# Patient Record
Sex: Male | Born: 1985 | Race: White | Hispanic: No | Marital: Single | Smoking: Never smoker
Health system: Southern US, Community
[De-identification: ages and names within clinical notes are randomized; demographics above are authoritative.]

## PROBLEM LIST (undated history)

## (undated) DIAGNOSIS — K829 Disease of gallbladder, unspecified: Secondary | ICD-10-CM

## (undated) DIAGNOSIS — J45909 Unspecified asthma, uncomplicated: Secondary | ICD-10-CM

---

## 2012-01-16 HISTORY — PX: WISDOM TOOTH EXTRACTION: SHX21

## 2013-10-15 HISTORY — PX: APPENDECTOMY: SHX54

## 2014-02-12 ENCOUNTER — Other Ambulatory Visit: Payer: Self-pay | Admitting: Gastroenterology

## 2014-02-12 DIAGNOSIS — R1012 Left upper quadrant pain: Secondary | ICD-10-CM

## 2014-02-16 ENCOUNTER — Ambulatory Visit
Admission: RE | Admit: 2014-02-16 | Discharge: 2014-02-16 | Disposition: A | Discharge status: Home / Self Care | Payer: 59 | Source: Ambulatory Visit | Attending: Gastroenterology | Admitting: Gastroenterology

## 2014-02-16 DIAGNOSIS — R1012 Left upper quadrant pain: Secondary | ICD-10-CM

## 2014-02-16 MED ORDER — IOHEXOL 300 MG/ML  SOLN
100.0000 mL | Freq: Once | INTRAMUSCULAR | Status: AC | PRN
  Administered 2014-02-16: 100 mL via INTRAVENOUS

## 2014-04-06 ENCOUNTER — Other Ambulatory Visit: Payer: Self-pay | Admitting: Gastroenterology

## 2014-04-06 DIAGNOSIS — R1011 Right upper quadrant pain: Secondary | ICD-10-CM

## 2014-04-14 ENCOUNTER — Ambulatory Visit (HOSPITAL_COMMUNITY)
Admission: RE | Admit: 2014-04-14 | Discharge: 2014-04-14 | Disposition: A | Discharge status: Home / Self Care | Payer: 59 | Source: Ambulatory Visit | Attending: Gastroenterology | Admitting: Gastroenterology

## 2014-04-14 DIAGNOSIS — R1011 Right upper quadrant pain: Secondary | ICD-10-CM | POA: Diagnosis present

## 2014-04-21 ENCOUNTER — Ambulatory Visit (HOSPITAL_COMMUNITY)
Admission: RE | Admit: 2014-04-21 | Discharge: 2014-04-21 | Disposition: A | Discharge status: Home / Self Care | Payer: 59 | Source: Ambulatory Visit | Attending: Gastroenterology | Admitting: Gastroenterology

## 2014-04-21 DIAGNOSIS — R1011 Right upper quadrant pain: Secondary | ICD-10-CM

## 2014-04-21 MED ORDER — SINCALIDE 5 MCG IJ SOLR
INTRAMUSCULAR | Status: AC
  Administered 2014-04-21: 1.82 ug via INTRAVENOUS
  Filled 2014-04-21: qty 5

## 2014-04-21 MED ORDER — TECHNETIUM TC 99M MEBROFENIN IV KIT
5.0000 | PACK | Freq: Once | INTRAVENOUS | Status: AC | PRN
  Administered 2014-04-21: 5 via INTRAVENOUS

## 2014-04-21 MED ORDER — SINCALIDE 5 MCG IJ SOLR
0.0200 ug/kg | Freq: Once | INTRAMUSCULAR | Status: AC
  Administered 2014-04-21: 1.82 ug via INTRAVENOUS

## 2014-05-11 ENCOUNTER — Other Ambulatory Visit: Payer: Self-pay | Admitting: Surgery

## 2014-05-12 ENCOUNTER — Other Ambulatory Visit (HOSPITAL_COMMUNITY): Payer: Self-pay | Admitting: *Deleted

## 2014-05-12 NOTE — Pre-Procedure Instructions (Signed)
Salvadore OxfordBrian Recendiz  05/12/2014   Your procedure is scheduled on:  Friday, May 14, 2014 at 7:30 AM.   Report to [facility] Entr[facility] "A" Admitting Office at 5:30 AM.   Call this number if you have problems the morning of surgery: [phone]   Remember:   Do not eat food or drink liquids after midnight tonight.   Take these medicines the morning of surgery with A SIP OF WATER: Percocet or Tylenol - if needed, Zofran - if needed   Do not wear jewelry.  Do not wear lotions, powders, or cologne. You may wear deodorant.  Men may shave face and neck.  Do not bring valuables to the hospital.  [facility] is not responsible                  for any belongings or valuables.               Contacts, dentures or bridgework may not be worn into surgery.  Leave suitcase in the car. After surgery it may be brought to your room.  For patients admitted to the hospital, discharge time is determined by your                treatment team.               Patients discharged the day of surgery will not be allowed to drive home.    Special Instructions: See "Preparing for Surgery" Instruction sheet.    Please read over the following fact sheets that you were given: Pain Booklet, Coughing and Deep Breathing and Surgical Site Infection Prevention

## 2014-05-13 ENCOUNTER — Encounter (HOSPITAL_COMMUNITY)
Admission: RE | Admit: 2014-05-13 | Discharge: 2014-05-13 | Disposition: A | Discharge status: Home / Self Care | Payer: 59 | Source: Ambulatory Visit | Attending: Surgery | Admitting: Surgery

## 2014-05-13 ENCOUNTER — Encounter (HOSPITAL_COMMUNITY): Payer: Self-pay

## 2014-05-13 DIAGNOSIS — K828 Other specified diseases of gallbladder: Secondary | ICD-10-CM | POA: Diagnosis present

## 2014-05-13 HISTORY — DX: Unspecified asthma, uncomplicated: J45.909

## 2014-05-13 HISTORY — DX: Disease of gallbladder, unspecified: K82.9

## 2014-05-13 LAB — CBC
HEMATOCRIT: 45.9 % (ref 39.0–52.0)
HEMOGLOBIN: 15.7 g/dL (ref 13.0–17.0)
MCH: 29.3 pg (ref 26.0–34.0)
MCHC: 34.2 g/dL (ref 30.0–36.0)
MCV: 85.8 fL (ref 78.0–100.0)
Platelets: 206 10*3/uL (ref 150–400)
RBC: 5.35 MIL/uL (ref 4.22–5.81)
RDW: 12.3 % (ref 11.5–15.5)
WBC: 6.3 10*3/uL (ref 4.0–10.5)

## 2014-05-13 NOTE — Progress Notes (Signed)
   05/13/14 1039  OBSTRUCTIVE SLEEP APNEA  Have you ever been diagnosed with sleep apnea through a sleep study? No  Do you snore loudly (loud enough to be heard through closed doors)?  0  Do you often feel tired, fatigued, or sleepy during the daytime? 0  Has anyone observed you stop breathing during your sleep? 0  Do you have, or are you being treated for high blood pressure? 0  BMI more than 35 kg/m2? 0  Age over 27 years old? 0  Neck circumference greater than 40 cm/16 inches? 1 (17)  Gender: 1

## 2014-05-13 NOTE — H&P (Signed)
Arthur Vaughan 05/11/2014 3:19 PM Location: [facility] Patient #: [id number] DOB: April 15, 1985 Single / Language: [place] / Race: White Male  History of Present Illness Riley Lam A. Magnus Ivan MD; 05/11/2014 3:40 PM) Patient words: Gallbladder.  The patient is a 29 year old male who presents with abdominal pain. This gentleman is referred by Dr. Camillia Herter for severe biliary dyskinesia. He has an interesting story. He was in [place] when he had acute appendicitis in October. Starting in December, he started having right-sided abdominal pain with nausea and vomiting after fatty meals. He now has symptoms daily. The pain can be moderate to severe. The pain refers to the periumbilical area. Bowel movements are normal. He is now having difficulty sleeping because of his discomfort.   Other Problems Rowe Clack, RN, BSN; 05/11/2014 3:19 PM) Other disease, cancer, significant illness  Past Surgical History Rowe Clack, RN, BSN; 05/11/2014 3:19 PM) Appendectomy Oral Surgery  Diagnostic Studies History Rowe Clack, RN, BSN; 05/11/2014 3:19 PM) Colonoscopy 5-10 years ago  Allergies Rowe Clack, RN, BSN; 05/11/2014 3:21 PM) No Known Drug Allergies04/26/2016  Medication History Rowe Clack, RN, BSN; 05/11/2014 3:22 PM) Oxycodone-Acetaminophen (5-325MG  Tablet, Oral as needed) Active. Acetaminophen (  Tablet, Oral as needed) Active. Medications Reconciled  Social History Personnel officer, RN, BSN; 05/11/2014 3:19 PM) Alcohol use Occasional alcohol use. Caffeine use Carbonated beverages, Tea. No drug use Tobacco use Never smoker.  Family History (Rowe Clack, RN, BSN; 05/11/2014 3:19 PM) Hypertension Father, Mother. Malignant Neoplasm Of Pancreas Mother. Melanoma Mother.  Review of Systems Glass blower/designer, BSN; 05/11/2014 3:19 PM) General Present- Fatigue and Weight Gain. Not Present- Appetite Loss, Chills, Fever, Night Sweats  and Weight Loss. HEENT Present- Wears glasses/contact lenses. Not Present- Earache, Hearing Loss, Hoarseness, Nose Bleed, Oral Ulcers, Ringing in the Ears, Seasonal Allergies, Sinus Pain, Sore Throat, Visual Disturbances and Yellow Eyes. Respiratory Not Present- Bloody sputum, Chronic Cough, Difficulty Breathing, Snoring and Wheezing. Breast Not Present- Breast Mass, Breast Pain, Nipple Discharge and Skin Changes. Cardiovascular Not Present- Chest Pain, Difficulty Breathing Lying Down, Leg Cramps, Palpitations, Rapid Heart Rate, Shortness of Breath and Swelling of Extremities. Gastrointestinal Present- Abdominal Pain, Bloating, Indigestion and Nausea. Not Present- Bloody Stool, Change in Bowel Habits, Chronic diarrhea, Constipation, Difficulty Swallowing, Excessive gas, Gets full quickly at meals, Hemorrhoids, Rectal Pain and Vomiting. Male Genitourinary Not Present- Blood in Urine, Change in Urinary Stream, Frequency, Impotence, Nocturia, Painful Urination, Urgency and Urine Leakage. Musculoskeletal Present- Muscle Pain and Muscle Weakness. Not Present- Back Pain, Joint Pain, Joint Stiffness and Swelling of Extremities. Psychiatric Present- Change in Sleep Pattern. Not Present- Anxiety, Bipolar, Depression, Fearful and Frequent crying.   Vitals Glass blower/designer, BSN; 05/11/2014 3:21 PM) 05/11/2014 3:20 PM Weight: 201.6 lb Height: 71in Body Surface Area: 2.14 m Body Mass Index: 28.12 kg/m Temp.: 98.70F(Oral)  Pulse: 63 (Regular)  Resp.: 20 (Unlabored)  P.OX: 98% (Room air) BP: 150/100 (Sitting, Left Arm, Standard)    Physical Exam (Paton Crum A. Magnus Ivan MD; 05/11/2014 3:40 PM) General Mental Status-Alert. General Appearance-Consistent with stated age. Hydration-Well hydrated. Voice-Normal.  Head and Neck Head-normocephalic, atraumatic with no lesions or palpable masses.  Eye Eyeball - Bilateral-Extraocular movements intact. Sclera/Conjunctiva -  Bilateral-No scleral icterus.  Chest and Lung Exam Chest and lung exam reveals -quiet, even and easy respiratory effort with no use of accessory muscles and on auscultation, normal breath sounds, no adventitious sounds and normal vocal resonance. Inspection Chest Wall - Normal. Back - normal.  Cardiovascular Cardiovascular examination reveals -on palpation PMI  is normal in location and amplitude, no palpable S3 or S4. Normal cardiac borders., normal heart sounds, regular rate and rhythm with no murmurs, carotid auscultation reveals no bruits and normal pedal pulses bilaterally.  Abdomen Inspection Inspection of the abdomen reveals - No Hernias. Skin - Scar - no surgical scars. Palpation/Percussion Palpation and Percussion of the abdomen reveal - Soft, No Rebound tenderness, No Rigidity (guarding) and No hepatosplenomegaly. Tenderness - Right Upper Quadrant. Auscultation Auscultation of the abdomen reveals - Bowel sounds normal.  Neurologic Neurologic evaluation reveals -alert and oriented x 3 with no impairment of recent or remote memory. Mental Status-Normal.  Musculoskeletal Normal Exam - Left-Upper Extremity Strength Normal and Lower Extremity Strength Normal. Normal Exam - Right-Upper Extremity Strength Normal, Lower Extremity Weakness.    Assessment & Plan (Felix Pratt A. Magnus IvanBlackman MD; 05/11/2014 3:42 PM) BILIARY DYSKINESIA (575.8  K82.8) Impression: I strongly recommend laparoscopic cholecystectomy. I gave him literature regarding the surgery. I do suspect he has chronic cholecystitis. I discussed the surgical procedure in detail. I discussed the risks of surgery which includes but is not limited to bleeding, infection, bile duct injury, bile leak, injury to other structures, the need to convert to an open procedure, the chance this may not resolve all his symptoms, postoperative recovery, DVT, etc. He understands and wishes to proceed ASAP Current Plans  Started  Percocet 5-325MG , 1 (one) Tablet every four hours, as needed, #30, 05/11/2014, No Refill. Started Zofran 4MG , 1 (one) Tablet every six hours, as needed, #30, 05/11/2014, No Refill. Pt Education - Pamphlet Given - Laparoscopic Gallbladder Surgery: discussed with patient and provided information.   Signed by Shelly Rubensteinouglas A Rakeen Gaillard, MD (05/11/2014 3:42 PM)

## 2014-05-14 ENCOUNTER — Ambulatory Visit (HOSPITAL_COMMUNITY): Payer: 59 | Admitting: Certified Registered"

## 2014-05-14 ENCOUNTER — Encounter (HOSPITAL_COMMUNITY): Payer: Self-pay | Admitting: *Deleted

## 2014-05-14 ENCOUNTER — Ambulatory Visit (HOSPITAL_COMMUNITY)
Admission: RE | Admit: 2014-05-14 | Discharge: 2014-05-14 | Disposition: A | Discharge status: Home / Self Care | Payer: 59 | Source: Ambulatory Visit | Attending: Surgery | Admitting: Surgery

## 2014-05-14 ENCOUNTER — Encounter (HOSPITAL_COMMUNITY)
Admission: RE | Disposition: A | Discharge status: Home / Self Care | Payer: Self-pay | Source: Ambulatory Visit | Attending: Surgery

## 2014-05-14 DIAGNOSIS — K828 Other specified diseases of gallbladder: Secondary | ICD-10-CM | POA: Insufficient documentation

## 2014-05-14 HISTORY — PX: CHOLECYSTECTOMY: SHX55

## 2014-05-14 SURGERY — LAPAROSCOPIC CHOLECYSTECTOMY
Anesthesia: General | Site: Abdomen

## 2014-05-14 MED ORDER — LIDOCAINE HCL (CARDIAC) 20 MG/ML IV SOLN
INTRAVENOUS | Status: AC
  Filled 2014-05-14: qty 5

## 2014-05-14 MED ORDER — KETOROLAC TROMETHAMINE 30 MG/ML IJ SOLN
INTRAMUSCULAR | Status: AC
  Filled 2014-05-14: qty 1

## 2014-05-14 MED ORDER — CEFAZOLIN SODIUM-DEXTROSE 2-3 GM-% IV SOLR
2.0000 g | INTRAVENOUS | Status: AC
  Administered 2014-05-14: 2 g via INTRAVENOUS

## 2014-05-14 MED ORDER — HYDROMORPHONE HCL 1 MG/ML IJ SOLN
INTRAMUSCULAR | Status: AC
  Administered 2014-05-14: 0.5 mg via INTRAVENOUS
  Filled 2014-05-14: qty 1

## 2014-05-14 MED ORDER — CEFAZOLIN SODIUM-DEXTROSE 2-3 GM-% IV SOLR
INTRAVENOUS | Status: AC
  Filled 2014-05-14: qty 50

## 2014-05-14 MED ORDER — SUCCINYLCHOLINE CHLORIDE 20 MG/ML IJ SOLN
INTRAMUSCULAR | Status: AC
  Filled 2014-05-14: qty 1

## 2014-05-14 MED ORDER — ROCURONIUM BROMIDE 50 MG/5ML IV SOLN
INTRAVENOUS | Status: AC
  Filled 2014-05-14: qty 1

## 2014-05-14 MED ORDER — PHENYLEPHRINE 40 MCG/ML (10ML) SYRINGE FOR IV PUSH (FOR BLOOD PRESSURE SUPPORT)
PREFILLED_SYRINGE | INTRAVENOUS | Status: AC
  Filled 2014-05-14: qty 10

## 2014-05-14 MED ORDER — PROPOFOL 10 MG/ML IV BOLUS
INTRAVENOUS | Status: AC
  Filled 2014-05-14: qty 20

## 2014-05-14 MED ORDER — NEOSTIGMINE METHYLSULFATE 10 MG/10ML IV SOLN
INTRAVENOUS | Status: DC | PRN
  Administered 2014-05-14: 3 mg via INTRAVENOUS

## 2014-05-14 MED ORDER — SODIUM CHLORIDE 0.9 % IJ SOLN: 3.0000 mL | INTRAMUSCULAR | Status: DC | PRN

## 2014-05-14 MED ORDER — LACTATED RINGERS IV SOLN
INTRAVENOUS | Status: DC | PRN
  Administered 2014-05-14 (×2): via INTRAVENOUS

## 2014-05-14 MED ORDER — SUCCINYLCHOLINE CHLORIDE 20 MG/ML IJ SOLN
INTRAMUSCULAR | Status: DC | PRN
  Administered 2014-05-14: 100 mg via INTRAVENOUS

## 2014-05-14 MED ORDER — ACETAMINOPHEN 650 MG RE SUPP: 650.0000 mg | RECTAL | Status: DC | PRN

## 2014-05-14 MED ORDER — OXYCODONE HCL 5 MG PO TABS
5.0000 mg | ORAL_TABLET | ORAL | Status: DC | PRN
  Administered 2014-05-14: 5 mg via ORAL

## 2014-05-14 MED ORDER — GLYCOPYRROLATE 0.2 MG/ML IJ SOLN
INTRAMUSCULAR | Status: AC
  Filled 2014-05-14: qty 1

## 2014-05-14 MED ORDER — HYDROMORPHONE HCL 1 MG/ML IJ SOLN
0.2500 mg | INTRAMUSCULAR | Status: DC | PRN
  Administered 2014-05-14 (×4): 0.5 mg via INTRAVENOUS

## 2014-05-14 MED ORDER — ACETAMINOPHEN 325 MG PO TABS: 650.0000 mg | ORAL_TABLET | ORAL | Status: DC | PRN

## 2014-05-14 MED ORDER — OXYCODONE HCL 5 MG PO TABS
5.0000 mg | ORAL_TABLET | Freq: Once | ORAL | Status: AC
  Administered 2014-05-14: 5 mg via ORAL

## 2014-05-14 MED ORDER — OXYCODONE-ACETAMINOPHEN 5-325 MG PO TABS: 1.0000 | ORAL_TABLET | ORAL | Status: AC | PRN

## 2014-05-14 MED ORDER — BUPIVACAINE-EPINEPHRINE 0.25% -1:200000 IJ SOLN
INTRAMUSCULAR | Status: DC | PRN
  Administered 2014-05-14: 20 mL

## 2014-05-14 MED ORDER — FENTANYL CITRATE (PF) 250 MCG/5ML IJ SOLN
INTRAMUSCULAR | Status: AC
  Filled 2014-05-14: qty 5

## 2014-05-14 MED ORDER — PROPOFOL 10 MG/ML IV BOLUS
INTRAVENOUS | Status: DC | PRN
  Administered 2014-05-14: 200 mg via INTRAVENOUS

## 2014-05-14 MED ORDER — 0.9 % SODIUM CHLORIDE (POUR BTL) OPTIME
TOPICAL | Status: DC | PRN
  Administered 2014-05-14: 1000 mL

## 2014-05-14 MED ORDER — MORPHINE SULFATE 2 MG/ML IJ SOLN: 1.0000 mg | INTRAMUSCULAR | Status: DC | PRN

## 2014-05-14 MED ORDER — EPHEDRINE SULFATE 50 MG/ML IJ SOLN
INTRAMUSCULAR | Status: AC
  Filled 2014-05-14: qty 1

## 2014-05-14 MED ORDER — SODIUM CHLORIDE 0.9 % IJ SOLN
INTRAMUSCULAR | Status: AC
  Filled 2014-05-14: qty 10

## 2014-05-14 MED ORDER — SODIUM CHLORIDE 0.9 % IR SOLN
Status: DC | PRN
  Administered 2014-05-14: 1000 mL

## 2014-05-14 MED ORDER — LIDOCAINE HCL (CARDIAC) 20 MG/ML IV SOLN
INTRAVENOUS | Status: DC | PRN
  Administered 2014-05-14: 40 mg via INTRAVENOUS

## 2014-05-14 MED ORDER — SODIUM CHLORIDE 0.9 % IV SOLN: 250.0000 mL | INTRAVENOUS | Status: DC | PRN

## 2014-05-14 MED ORDER — ONDANSETRON HCL 4 MG/2ML IJ SOLN
INTRAMUSCULAR | Status: AC
  Filled 2014-05-14: qty 2

## 2014-05-14 MED ORDER — DEXAMETHASONE SODIUM PHOSPHATE 4 MG/ML IJ SOLN
INTRAMUSCULAR | Status: AC
  Filled 2014-05-14: qty 2

## 2014-05-14 MED ORDER — GLYCOPYRROLATE 0.2 MG/ML IJ SOLN
INTRAMUSCULAR | Status: DC | PRN
  Administered 2014-05-14: 0.4 mg via INTRAVENOUS
  Administered 2014-05-14: 0.2 mg via INTRAVENOUS

## 2014-05-14 MED ORDER — MIDAZOLAM HCL 5 MG/5ML IJ SOLN
INTRAMUSCULAR | Status: DC | PRN
  Administered 2014-05-14: 2 mg via INTRAVENOUS

## 2014-05-14 MED ORDER — KETOROLAC TROMETHAMINE 30 MG/ML IJ SOLN
INTRAMUSCULAR | Status: DC | PRN
  Administered 2014-05-14: 30 mg via INTRAVENOUS

## 2014-05-14 MED ORDER — ROCURONIUM BROMIDE 100 MG/10ML IV SOLN
INTRAVENOUS | Status: DC | PRN
  Administered 2014-05-14: 20 mg via INTRAVENOUS

## 2014-05-14 MED ORDER — FENTANYL CITRATE (PF) 100 MCG/2ML IJ SOLN
INTRAMUSCULAR | Status: DC | PRN
  Administered 2014-05-14 (×2): 100 ug via INTRAVENOUS
  Administered 2014-05-14: 50 ug via INTRAVENOUS

## 2014-05-14 MED ORDER — BUPIVACAINE-EPINEPHRINE (PF) 0.25% -1:200000 IJ SOLN
INTRAMUSCULAR | Status: AC
  Filled 2014-05-14: qty 30

## 2014-05-14 MED ORDER — DEXAMETHASONE SODIUM PHOSPHATE 4 MG/ML IJ SOLN
INTRAMUSCULAR | Status: DC | PRN
  Administered 2014-05-14: 8 mg via INTRAVENOUS

## 2014-05-14 MED ORDER — OXYCODONE HCL 5 MG PO TABS
ORAL_TABLET | ORAL | Status: AC
  Filled 2014-05-14: qty 1

## 2014-05-14 MED ORDER — MIDAZOLAM HCL 2 MG/2ML IJ SOLN
INTRAMUSCULAR | Status: AC
  Filled 2014-05-14: qty 2

## 2014-05-14 MED ORDER — SODIUM CHLORIDE 0.9 % IJ SOLN: 3.0000 mL | Freq: Two times a day (BID) | INTRAMUSCULAR | Status: DC

## 2014-05-14 MED ORDER — ONDANSETRON HCL 4 MG/2ML IJ SOLN
INTRAMUSCULAR | Status: DC | PRN
Start: 2014-05-14 — End: 2014-05-14
  Administered 2014-05-14 (×2): 4 mg via INTRAVENOUS

## 2014-05-14 MED ORDER — OXYCODONE HCL 5 MG PO TABS
ORAL_TABLET | ORAL | Status: AC
  Administered 2014-05-14: 5 mg via ORAL
  Filled 2014-05-14: qty 1

## 2014-05-14 SURGICAL SUPPLY — 37 items
APPLIER CLIP 5 13 M/L LIGAMAX5 (MISCELLANEOUS) ×3
CANISTER SUCTION 2500CC (MISCELLANEOUS) ×3 IMPLANT
CHLORAPREP W/TINT 26ML (MISCELLANEOUS) ×3 IMPLANT
CLIP APPLIE 5 13 M/L LIGAMAX5 (MISCELLANEOUS) ×1 IMPLANT
COVER SURGICAL LIGHT HANDLE (MISCELLANEOUS) ×3 IMPLANT
DRAPE LAPAROSCOPIC ABDOMINAL (DRAPES) ×3 IMPLANT
ELECT REM PT RETURN 9FT ADLT (ELECTROSURGICAL) ×3
ELECTRODE REM PT RTRN 9FT ADLT (ELECTROSURGICAL) ×1 IMPLANT
GLOVE BIOGEL PI IND STRL 6.5 (GLOVE) ×1 IMPLANT
GLOVE BIOGEL PI IND STRL 7.0 (GLOVE) ×1 IMPLANT
GLOVE BIOGEL PI IND STRL 8 (GLOVE) ×1 IMPLANT
GLOVE BIOGEL PI INDICATOR 6.5 (GLOVE) ×2
GLOVE BIOGEL PI INDICATOR 7.0 (GLOVE) ×2
GLOVE BIOGEL PI INDICATOR 8 (GLOVE) ×2
GLOVE SURG SIGNA 7.5 PF LTX (GLOVE) ×3 IMPLANT
GLOVE SURG SS PI 6.0 STRL IVOR (GLOVE) ×3 IMPLANT
GOWN STRL REUS W/ TWL LRG LVL3 (GOWN DISPOSABLE) ×2 IMPLANT
GOWN STRL REUS W/ TWL XL LVL3 (GOWN DISPOSABLE) ×2 IMPLANT
GOWN STRL REUS W/TWL LRG LVL3 (GOWN DISPOSABLE) ×4
GOWN STRL REUS W/TWL XL LVL3 (GOWN DISPOSABLE) ×4
KIT BASIN OR (CUSTOM PROCEDURE TRAY) ×3 IMPLANT
KIT ROOM TURNOVER OR (KITS) ×3 IMPLANT
LIQUID BAND (GAUZE/BANDAGES/DRESSINGS) ×3 IMPLANT
NS IRRIG 1000ML POUR BTL (IV SOLUTION) ×3 IMPLANT
PAD ARMBOARD 7.5X6 YLW CONV (MISCELLANEOUS) ×3 IMPLANT
POUCH SPECIMEN RETRIEVAL 10MM (ENDOMECHANICALS) ×3 IMPLANT
SCISSORS LAP 5X35 DISP (ENDOMECHANICALS) ×3 IMPLANT
SET IRRIG TUBING LAPAROSCOPIC (IRRIGATION / IRRIGATOR) ×3 IMPLANT
SLEEVE ENDOPATH XCEL 5M (ENDOMECHANICALS) ×6 IMPLANT
SPECIMEN JAR SMALL (MISCELLANEOUS) ×3 IMPLANT
SUT MON AB 4-0 PC3 18 (SUTURE) ×3 IMPLANT
TOWEL OR 17X24 6PK STRL BLUE (TOWEL DISPOSABLE) ×3 IMPLANT
TOWEL OR 17X26 10 PK STRL BLUE (TOWEL DISPOSABLE) IMPLANT
TRAY LAPAROSCOPIC (CUSTOM PROCEDURE TRAY) ×3 IMPLANT
TROCAR XCEL BLUNT TIP 100MML (ENDOMECHANICALS) ×3 IMPLANT
TROCAR XCEL NON-BLD 5MMX100MML (ENDOMECHANICALS) ×3 IMPLANT
TUBING INSUFFLATION (TUBING) ×3 IMPLANT

## 2014-05-14 NOTE — Transfer of Care (Signed)
Immediate Anesthesia Transfer of Care Note  Patient: Arthur OxfordBrian Mccollister  Procedure(s) Performed: Procedure(s): LAPAROSCOPIC CHOLECYSTECTOMY (N/A)  Patient Location: PACU  Anesthesia Type:General  Level of Consciousness: awake, alert , oriented and patient cooperative  Airway & Oxygen Therapy: Patient Spontanous Breathing and Patient connected to nasal cannula oxygen  Post-op Assessment: Report given to RN, Post -op Vital signs reviewed and stable and Patient moving all extremities  Post vital signs: Reviewed and stable  Last Vitals:  Filed Vitals:   05/14/14 0548  BP: 122/52  Pulse: 56  Temp: 36.6 C  Resp: 20    Complications: No apparent anesthesia complications

## 2014-05-14 NOTE — Op Note (Signed)
Laparoscopic Cholecystectomy Procedure Note  Indications: This patient presents with symptomatic gallbladder disease and will undergo laparoscopic cholecystectomy.  Pre-operative Diagnosis: BILIARY DYSKINESIA  Post-operative Diagnosis: Same  Surgeon: Abigail MiyamotoBLACKMAN,Maurizio Geno A   Assistants: 0  Anesthesia: General endotracheal anesthesia  ASA Class: 1  Procedure Details  The patient was seen again in the Holding Room. The risks, benefits, complications, treatment options, and expected outcomes were discussed with the patient. The possibilities of reaction to medication, pulmonary aspiration, perforation of viscus, bleeding, recurrent infection, finding a normal gallbladder, the need for additional procedures, failure to diagnose a condition, the possible need to convert to an open procedure, and creating a complication requiring transfusion or operation were discussed with the patient. The likelihood of improving the patient's symptoms with return to their baseline status is good.  The patient and/or family concurred with the proposed plan, giving informed consent. The site of surgery properly noted. The patient was taken to Operating Room, identified as Arthur OxfordBrian Vaughan and the procedure verified as Laparoscopic Cholecystectomy with Intraoperative Cholangiogram. A Time Out was held and the above information confirmed.  Prior to the induction of general anesthesia, antibiotic prophylaxis was administered. General endotracheal anesthesia was then administered and tolerated well. After the induction, the abdomen was prepped with Chloraprep and draped in sterile fashion. The patient was positioned in the supine position.  Local anesthetic agent was injected into the skin near the umbilicus and an incision made. We dissected down to the abdominal fascia with blunt dissection.  The fascia was incised vertically and we entered the peritoneal cavity bluntly.  A pursestring suture of 0-Vicryl was placed around the  fascial opening.  The Hasson cannula was inserted and secured with the stay suture.  Pneumoperitoneum was then created with CO2 and tolerated well without any adverse changes in the patient's vital signs. A 5-mm port was placed in the subxiphoid position.  Two 5-mm ports were placed in the right upper quadrant. All skin incisions were infiltrated with a local anesthetic agent before making the incision and placing the trocars.   We positioned the patient in reverse Trendelenburg, tilted slightly to the patient's left.  The gallbladder was identified, the fundus grasped and retracted cephalad. Adhesions were lysed bluntly and with the electrocautery where indicated, taking care not to injure any adjacent organs or viscus. The infundibulum was grasped and retracted laterally, exposing the peritoneum overlying the triangle of Calot. This was then divided and exposed in a blunt fashion. The cystic duct was clearly identified and bluntly dissected circumferentially. A critical view of the cystic duct and cystic artery was obtained.  The cystic duct was then ligated with clips and divided. The cystic artery was, dissected free, ligated with clips and divided as well.   The gallbladder was dissected from the liver bed in retrograde fashion with the electrocautery. The gallbladder was removed and placed in an Endocatch sac. The liver bed was irrigated and inspected. Hemostasis was achieved with the electrocautery. Copious irrigation was utilized and was repeatedly aspirated until clear.  The gallbladder and Endocatch sac were then removed through the umbilical port site.  The pursestring suture was used to close the umbilical fascia.    We again inspected the right upper quadrant for hemostasis.  Pneumoperitoneum was released as we removed the trocars.  4-0 Monocryl was used to close the skin.   Skin glue was then applied. The patient was then extubated and brought to the recovery room in stable condition. Instrument,  sponge, and needle counts were correct  at closure and at the conclusion of the case.   Findings: Minimal Cholecystitis without Cholelithiasis  Estimated Blood Loss: Minimal         Drains: 0         Specimens: Gallbladder           Complications: None; patient tolerated the procedure well.         Disposition: PACU - hemodynamically stable.         Condition: stable

## 2014-05-14 NOTE — Discharge Instructions (Signed)
[facility] LAPAROSCOP[facility]: POST OP INSTRUCTIONS Always review your discharge instruction sheet given to you by the facility where your surgery was performed. IF YOU HAVE DISABILITY OR FAMILY LEAVE FORMS, YOU MUST BRING THEM TO THE OFFICE FOR PROCESSING.   DO NOT GIVE THEM TO YOUR DOCTOR.  1. A prescription for pain medication may be given to you upon discharge.  Take your pain medication as prescribed, if needed.  If narcotic pain medicine is not needed, then you may take acetaminophen (Tylenol) or ibuprofen (Advil) as needed. 2. Take your usually prescribed medications unless otherwise directed. 3. If you need a refill on your pain medication, please contact your pharmacy.  They will contact our office to request authorization. Prescriptions will not be filled after 5pm or on week-ends. 4. You should follow a light diet the first few days after arrival home, such as soup and crackers, etc.  Be sure to include lots of fluids daily. 5. Most patients will experience some swelling and bruising in the area of the incisions.  Ice packs will help.  Swelling and bruising can take several days to resolve.  6. It is common to experience some constipation if taking pain medication after surgery.  Increasing fluid intake and taking a stool softener (such as Colace) will usually help or prevent this problem from occurring.  A mild laxative (Milk of Magnesia or Miralax) should be taken according to package instructions if there are no bowel movements after 48 hours. 7. Unless discharge instructions indicate otherwise, you may remove your bandages 24-48 hours after surgery, and you may shower at that time.  You may have steri-strips (small skin tapes) in place directly over the incision.  These strips should be left on the skin for 7-10 days.  If your surgeon used skin glue on the incision, you may shower in 24 hours.  The glue will flake off over the next 2-3 weeks.  Any sutures or  staples will be removed at the office during your follow-up visit. 8. ACTIVITIES:  You may resume regular (light) daily activities beginning the next day--such as daily self-care, walking, climbing stairs--gradually increasing activities as tolerated.  You may have sexual intercourse when it is comfortable.  Refrain from any heavy lifting or straining until approved by your doctor. a. You may drive when you are no longer taking prescription pain medication, you can comfortably wear a seatbelt, and you can safely maneuver your car and apply brakes. b. RETURN TO WORK:  __________________________________________________________ 9. You should see your doctor in the office for a follow-up appointment approximately 2-3 weeks after your surgery.  Make sure that you call for this appointment within a day or two after you arrive home to insure a convenient appointment time. 10. OTHER INSTRUCTIONS: __NO LIFTING MORE THAN 15 TO 20 POUNDS FOR 2 WEEKS 11. MAY SHOWER STARTING TOMORROW 12. ICE PACK AND IBUPROFEN ALSO FOR PAIN. 13. YOU CAN TAKE UP TO 2 PERCOCET EVERY  FOUR HOURS AS NEEDED FOR PAIN. 14. ________________________________________________________________________________________________________________________ __________________________________________________________________________________________________________________________ WHEN TO CALL YOUR DOCTOR: 1. Fever over 101.0 2. Inability to urinate 3. Continued bleeding from incision. 4. Increased pain, redness, or drainage from the incision. 5. Increasing abdominal pain  The clinic staff is available to answer your questions during regular business hours.  Please dont hesitate to call and ask to speak to one of the nurses for clinical concerns.  If you have a medical emergency, go to the nearest emergency room or call 911.  A  surgeon from [facility] is always on call at the hospital. [address], [address], [place], [place]   [address] ? [address] [address], [place], [place]   [address] [phone] ? [phone] ? FAX [phone]) [phone] Web site: [email or web address]

## 2014-05-14 NOTE — Anesthesia Procedure Notes (Signed)
Procedure Name: Intubation Date/Time: 05/14/2014 7:25 AM Performed by: Jerilee HohMUMM, Lillianna Sabel N Pre-anesthesia Checklist: Patient identified, Emergency Drugs available, Suction available and Patient being monitored Patient Re-evaluated:Patient Re-evaluated prior to inductionOxygen Delivery Method: Circle system utilized Preoxygenation: Pre-oxygenation with 100% oxygen Intubation Type: IV induction Ventilation: Mask ventilation without difficulty Laryngoscope Size: Mac and 4 Grade View: Grade II Tube type: Oral Tube size: 7.5 mm Number of attempts: 1 Airway Equipment and Method: Stylet Placement Confirmation: ETT inserted through vocal cords under direct vision,  positive ETCO2 and breath sounds checked- equal and bilateral Secured at: 22 cm Tube secured with: Tape Dental Injury: Teeth and Oropharynx as per pre-operative assessment

## 2014-05-14 NOTE — Progress Notes (Signed)
Report given to [place] rn as caregiver 

## 2014-05-14 NOTE — Anesthesia Preprocedure Evaluation (Addendum)
Anesthesia Evaluation  Patient identified by MRN, date of birth, ID band Patient awake    Reviewed: Allergy & Precautions, H&P , NPO status , Patient's Chart, lab work & pertinent test results  Airway Mallampati: II TM Distance: >3 FB Neck ROM: Full    Dental no notable dental hx. (+) Teeth Intact, Dental Advisory Given   Pulmonary neg pulmonary ROS,  breath sounds clear to auscultation  Pulmonary exam normal       Cardiovascular negative cardio ROS  Rhythm:Regular Rate:Normal     Neuro/Psych negative neurological ROS  negative psych ROS   GI/Hepatic negative GI ROS, Neg liver ROS,   Endo/Other  negative endocrine ROS  Renal/GU negative Renal ROS  negative genitourinary   Musculoskeletal   Abdominal   Peds  Hematology negative hematology ROS (+)   Anesthesia Other Findings   Reproductive/Obstetrics negative OB ROS                          Anesthesia Physical Anesthesia Plan  ASA: I  Anesthesia Plan: General   Post-op Pain Management:    Induction: Intravenous  Airway Management Planned: Oral ETT  Additional Equipment:   Intra-op Plan:   Post-operative Plan: Extubation in OR  Informed Consent: I have reviewed the patients History and Physical, chart, labs and discussed the procedure including the risks, benefits and alternatives for the proposed anesthesia with the patient or authorized representative who has indicated his/her understanding and acceptance.   Dental advisory given  Plan Discussed with: CRNA  Anesthesia Plan Comments:         Anesthesia Quick Evaluation  

## 2014-05-14 NOTE — Interval H&P Note (Signed)
History and Physical Interval Note: no change in H and P  05/14/2014 6:49 AM  Arthur Vaughan  has presented today for surgery, with the diagnosis of Biliary Dyskenisia  The various methods of treatment have been discussed with the patient and family. After consideration of risks, benefits and other options for treatment, the patient has consented to  Procedure(s): LAPAROSCOPIC CHOLECYSTECTOMY (N/A) as a surgical intervention .  The patient's history has been reviewed, patient examined, no change in status, stable for surgery.  I have reviewed the patient's chart and labs.  Questions were answered to the patient's satisfaction.     [name] A

## 2014-05-14 NOTE — Anesthesia Postprocedure Evaluation (Signed)
  Anesthesia Post-op Note  Patient: Arthur Vaughan  Procedure(s) Performed: Procedure(s): LAPAROSCOPIC CHOLECYSTECTOMY (N/A)  Patient Location: PACU  Anesthesia Type:General  Level of Consciousness: awake and alert   Airway and Oxygen Therapy: Patient Spontanous Breathing  Post-op Pain: moderate  Post-op Assessment: Post-op Vital signs reviewed, Patient's Cardiovascular Status Stable and Respiratory Function Stable  Post-op Vital Signs: Reviewed  Filed Vitals:   05/14/14 0928  BP: 132/60  Pulse: 55  Temp:   Resp: 16    Complications: No apparent anesthesia complications

## 2014-05-17 ENCOUNTER — Encounter (HOSPITAL_COMMUNITY): Payer: Self-pay | Admitting: Surgery

## 2015-10-01 IMAGING — CT CT ABD-PELV W/ CM
2 of 4 series · 16 of 46 positions shown, 18 images · IV contrast (READICAT/WATER & [ID] OMNI 300)
Comparison: None.

CLINICAL DATA: Periumbilical pressure and pain for 1 month.
Reported possible colonic inflammation on outside CT treated with
antibiotics. Previous appendectomy. Initial encounter.

EXAM:
CT ABDOMEN AND PELVIS WITH CONTRAST
TECHNIQUE: Multidetector CT imaging of the abdomen and pelvis was performed
using the standard protocol following bolus administration of
intravenous contrast.
CONTRAST:  100mL OMNIPAQUE IOHEXOL 300 MG/ML  SOLN

[Series 2: abd/pelvis with · axial · 0.70mm/px · z∈[-428,-48]mm · 13 of 81 slices shown, 15 images]
[im 4/81  soft-tissue]
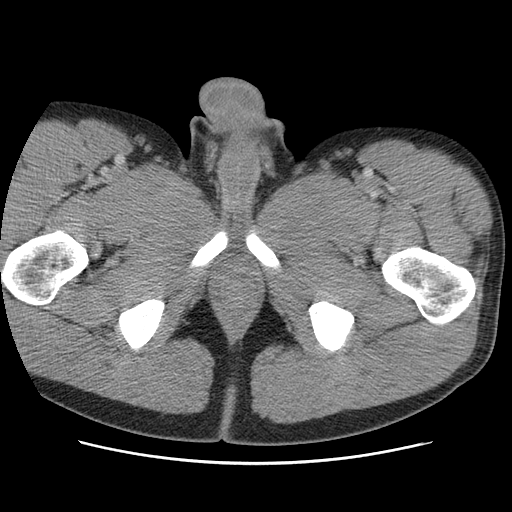
[im 4/81  bone]
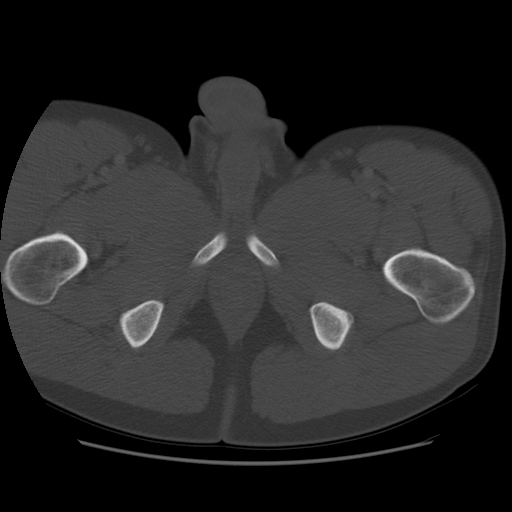
[im 11/81  soft-tissue]
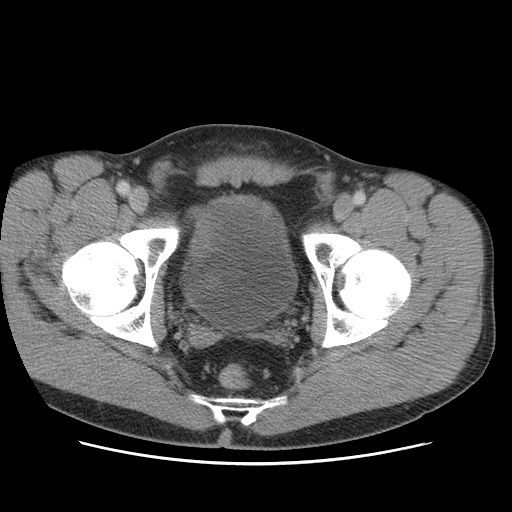
[im 17/81  soft-tissue]
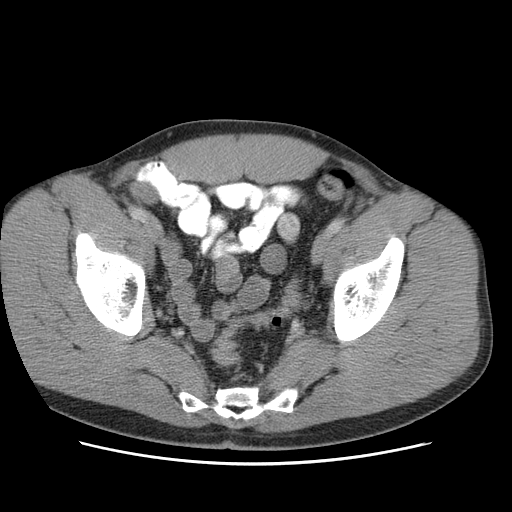
[im 24/81  soft-tissue]
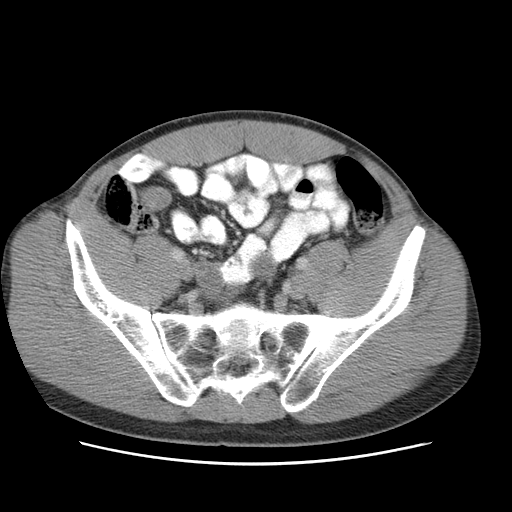
[im 27/81  soft-tissue]
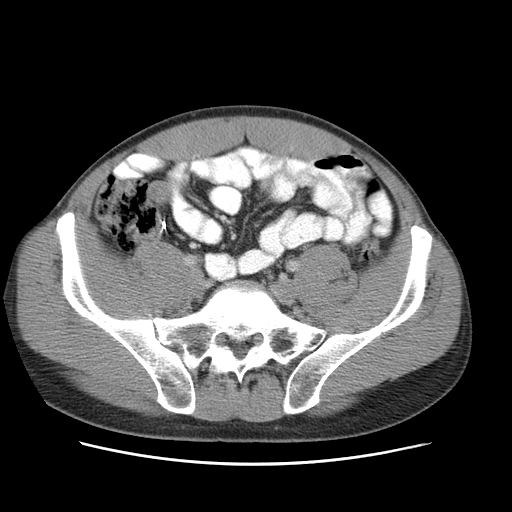
[im 34/81  soft-tissue]
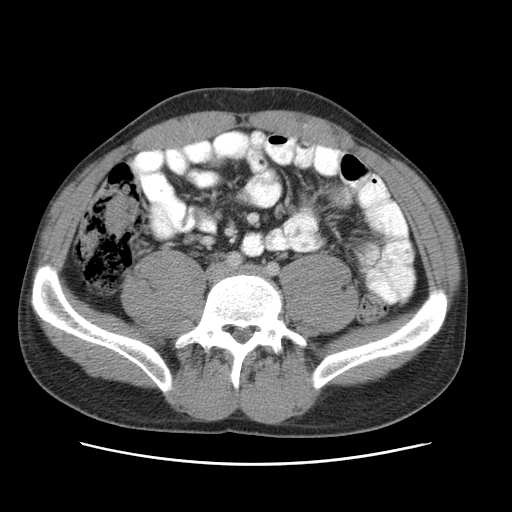
[im 41/81  soft-tissue]
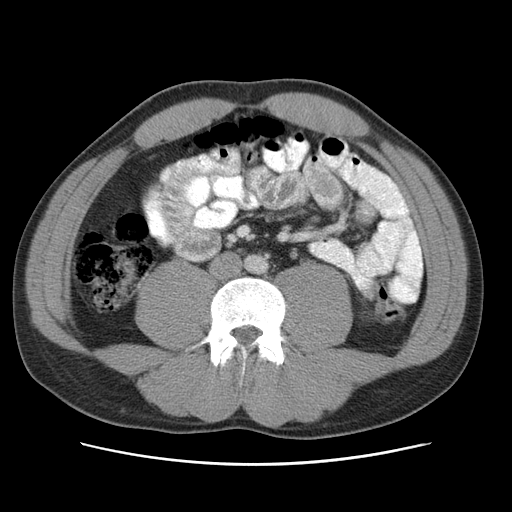
[im 47/81  soft-tissue]
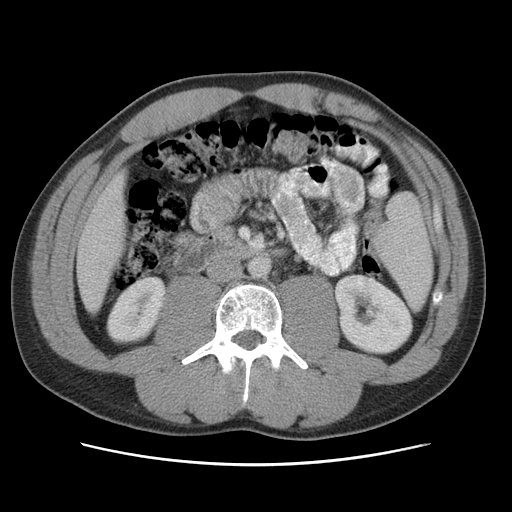
[im 54/81  soft-tissue]
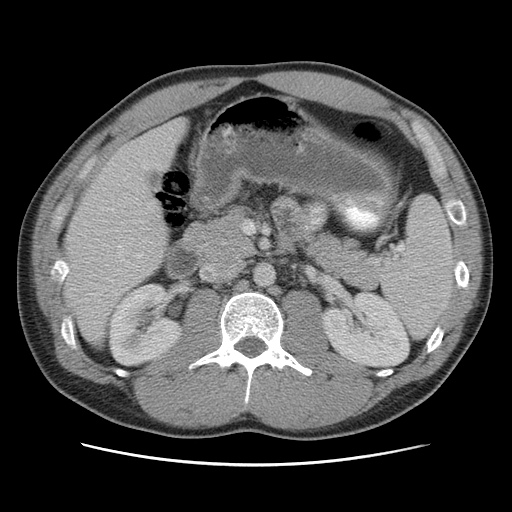
[im 54/81  bone]
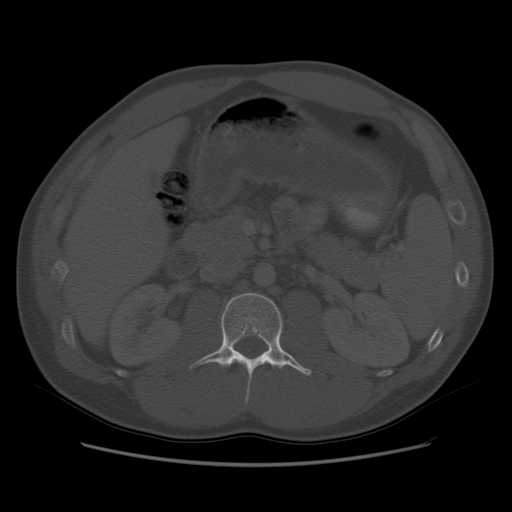
[im 57/81  soft-tissue]
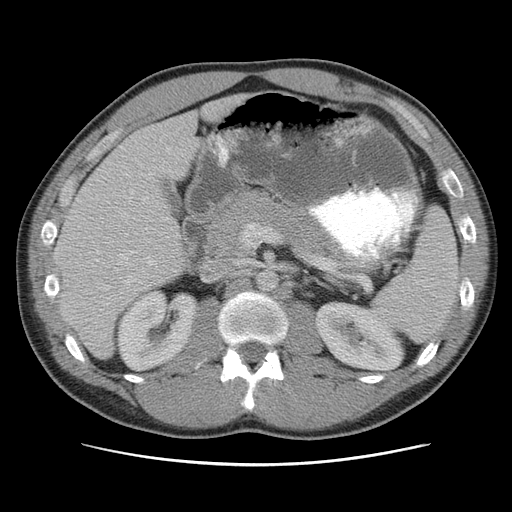
[im 64/81  soft-tissue]
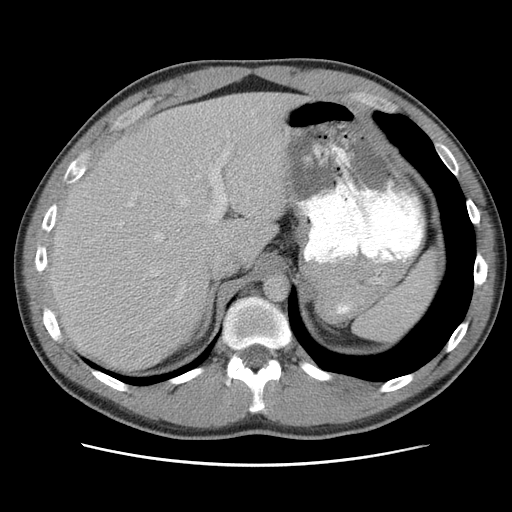
[im 71/81  soft-tissue]
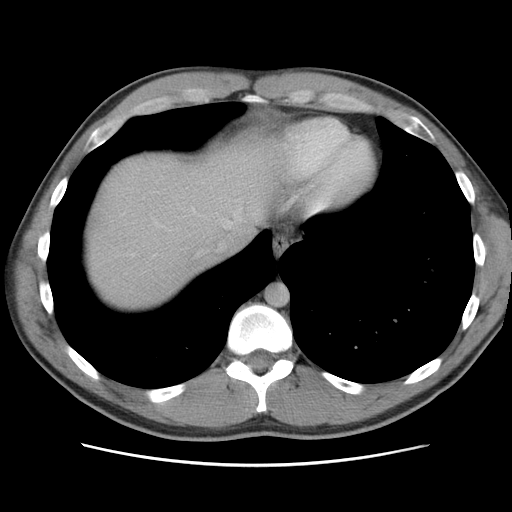
[im 77/81  soft-tissue]
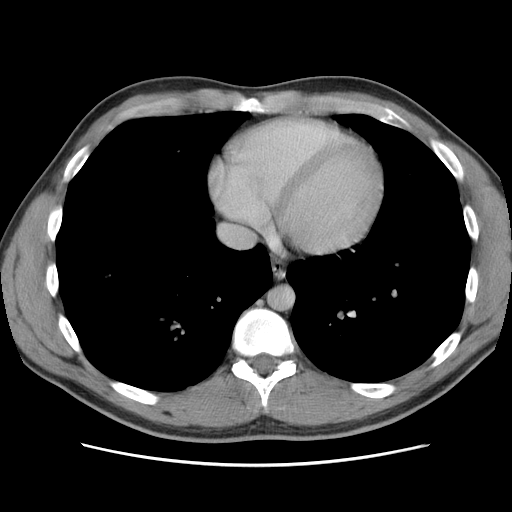

[Series 400: cor · coronal · 0.93mm/px · 3 of 139 slices shown]
[im 47/139  soft-tissue]
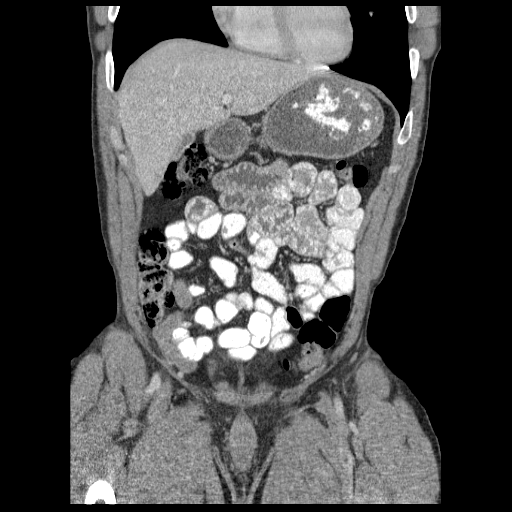
[im 62/139  soft-tissue]
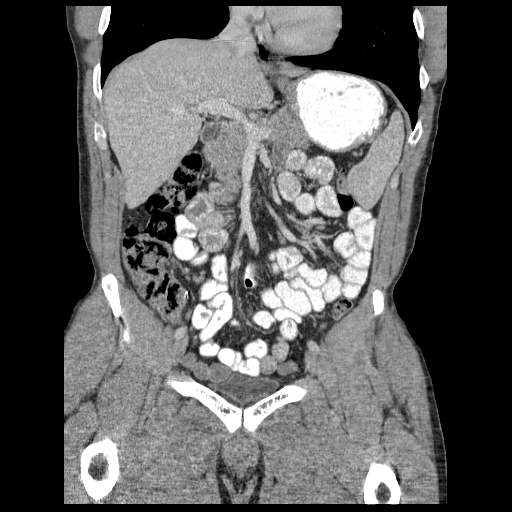
[im 77/139  soft-tissue]
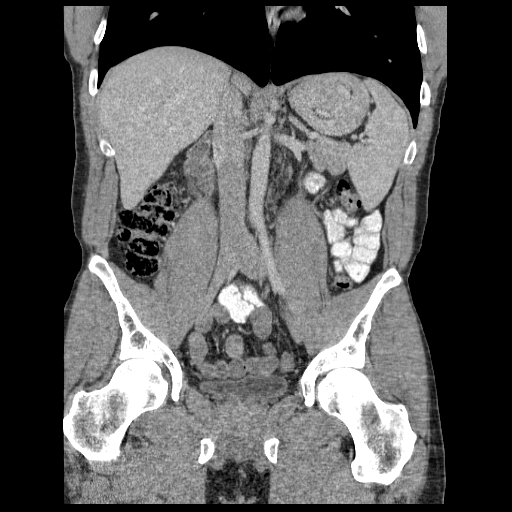

[16 of 46 positions shown; findings below may reference images not displayed]

FINDINGS: Lower chest: Clear lung bases. No significant pleural or pericardial
effusion.

Hepatobiliary: The liver is normal in density without focal
abnormality. No evidence of gallstones, gallbladder wall thickening
or biliary dilatation. The gallbladder is poorly distended.

Pancreas: Unremarkable. No pancreatic ductal dilatation or
surrounding inflammatory changes.

Spleen: Normal in size without focal abnormality.

Adrenals/Urinary Tract: Both adrenal glands appear normal.The
kidneys appear normal without evidence of urinary tract calculus or
hydronephrosis. No bladder abnormalities are seen.

Stomach/Bowel: No evidence of bowel wall thickening, distention or
surrounding inflammatory change.There are pericecal postsurgical
changes consistent with prior appendectomy. No extraluminal fluid
collections demonstrated.

Vascular/Lymphatic: There are no enlarged abdominal or pelvic lymph
nodes. No significant vascular findings are present.

Reproductive: Unremarkable.

Other: No evidence of abdominal wall mass or hernia.

Musculoskeletal: No acute osseous findings. There is ankylosis of
the left sacroiliac joint inferiorly without obvious erosive
changes.
IMPRESSION: 1. No acute findings. Specifically, no evidence of residual colitis,
bowel obstruction or active inflammation.
2. Ankylosis of the left sacroiliac joint inferiorly. This could be
secondary to previous sacroiliitis. Consider seronegative
spondyloarthropathy.

## 2015-12-03 ENCOUNTER — Encounter (HOSPITAL_COMMUNITY): Payer: Self-pay | Admitting: *Deleted

## 2015-12-03 ENCOUNTER — Emergency Department (HOSPITAL_COMMUNITY)
Admission: EM | Admit: 2015-12-03 | Discharge: 2015-12-03 | Disposition: A | Discharge status: Home / Self Care | Payer: 59 | Attending: Emergency Medicine | Admitting: Emergency Medicine

## 2015-12-03 DIAGNOSIS — R945 Abnormal results of liver function studies: Secondary | ICD-10-CM | POA: Diagnosis not present

## 2015-12-03 DIAGNOSIS — J45909 Unspecified asthma, uncomplicated: Secondary | ICD-10-CM | POA: Insufficient documentation

## 2015-12-03 DIAGNOSIS — R1011 Right upper quadrant pain: Secondary | ICD-10-CM | POA: Diagnosis present

## 2015-12-03 DIAGNOSIS — R109 Unspecified abdominal pain: Secondary | ICD-10-CM

## 2015-12-03 DIAGNOSIS — R748 Abnormal levels of other serum enzymes: Secondary | ICD-10-CM

## 2015-12-03 DIAGNOSIS — Z79899 Other long term (current) drug therapy: Secondary | ICD-10-CM | POA: Diagnosis not present

## 2015-12-03 LAB — CBC
HCT: 46.8 % (ref 39.0–52.0)
Hemoglobin: 16.2 g/dL (ref 13.0–17.0)
MCH: 29.6 pg (ref 26.0–34.0)
MCHC: 34.6 g/dL (ref 30.0–36.0)
MCV: 85.4 fL (ref 78.0–100.0)
Platelets: 187 10*3/uL (ref 150–400)
RBC: 5.48 MIL/uL (ref 4.22–5.81)
RDW: 12.4 % (ref 11.5–15.5)
WBC: 8.7 10*3/uL (ref 4.0–10.5)

## 2015-12-03 LAB — DIFFERENTIAL
BASOS PCT: 0 %
Basophils Absolute: 0 10*3/uL (ref 0.0–0.1)
EOS ABS: 0 10*3/uL (ref 0.0–0.7)
Eosinophils Relative: 0 %
Lymphocytes Relative: 5 %
Lymphs Abs: 0.4 10*3/uL — ABNORMAL LOW (ref 0.7–4.0)
MONO ABS: 0.3 10*3/uL (ref 0.1–1.0)
MONOS PCT: 3 %
Neutro Abs: 8 10*3/uL — ABNORMAL HIGH (ref 1.7–7.7)
Neutrophils Relative %: 92 %

## 2015-12-03 LAB — COMPREHENSIVE METABOLIC PANEL
ALT: 109 U/L — ABNORMAL HIGH (ref 17–63)
AST: 148 U/L — ABNORMAL HIGH (ref 15–41)
Albumin: 4.5 g/dL (ref 3.5–5.0)
Alkaline Phosphatase: 72 U/L (ref 38–126)
Anion gap: 9 (ref 5–15)
BUN: 16 mg/dL (ref 6–20)
CO2: 24 mmol/L (ref 22–32)
Calcium: 9.8 mg/dL (ref 8.9–10.3)
Chloride: 104 mmol/L (ref 101–111)
Creatinine, Ser: 1.15 mg/dL (ref 0.61–1.24)
GFR calc Af Amer: >60 mL/min (ref >60)
GFR calc non Af Amer: >60 mL/min (ref >60)
Glucose, Bld: 106 mg/dL — ABNORMAL HIGH (ref 65–99)
Potassium: 4.1 mmol/L (ref 3.5–5.1)
Sodium: 137 mmol/L (ref 135–145)
Total Bilirubin: 1.6 mg/dL — ABNORMAL HIGH (ref 0.3–1.2)
Total Protein: 7 g/dL (ref 6.5–8.1)

## 2015-12-03 LAB — URINALYSIS, ROUTINE W REFLEX MICROSCOPIC
Bilirubin Urine: NEGATIVE
Glucose, UA: NEGATIVE mg/dL
Hgb urine dipstick: NEGATIVE
Ketones, ur: 40 mg/dL — AB
Leukocytes, UA: NEGATIVE
Nitrite: NEGATIVE
Protein, ur: NEGATIVE mg/dL
Specific Gravity, Urine: 1.025 (ref 1.005–1.030)
pH: 5.5 (ref 5.0–8.0)

## 2015-12-03 LAB — LIPASE, BLOOD: Lipase: 41 U/L (ref 11–51)

## 2015-12-03 MED ORDER — SODIUM CHLORIDE 0.9 % IV BOLUS (SEPSIS)
1000.0000 mL | Freq: Once | INTRAVENOUS | Status: AC
  Administered 2015-12-03: 1000 mL via INTRAVENOUS

## 2015-12-03 MED ORDER — ONDANSETRON HCL 4 MG/2ML IJ SOLN
4.0000 mg | Freq: Once | INTRAMUSCULAR | Status: AC
  Administered 2015-12-03: 4 mg via INTRAVENOUS
  Filled 2015-12-03: qty 2

## 2015-12-03 MED ORDER — ONDANSETRON 4 MG PO TBDP: 4.0000 mg | ORAL_TABLET | Freq: Three times a day (TID) | ORAL | 0 refills | Status: AC | PRN

## 2015-12-03 MED ORDER — TRAMADOL HCL 50 MG PO TABS: 50.0000 mg | ORAL_TABLET | Freq: Four times a day (QID) | ORAL | 0 refills | Status: AC | PRN

## 2015-12-03 MED ORDER — OXYCODONE-ACETAMINOPHEN 5-325 MG PO TABS
1.0000 | ORAL_TABLET | Freq: Once | ORAL | Status: AC
  Administered 2015-12-03: 1 via ORAL
  Filled 2015-12-03: qty 1

## 2015-12-03 NOTE — Discharge Instructions (Signed)
Please call Dr. Loreta AveMann on Monday to schedule a follow up appointment. I would like her to recheck your liver enzymes at this appointment. Zofran as needed for nausea.   Please seek immediate care if you develop any of the following symptoms: The pain does not go away.  You have a fever.  You keep throwing up (vomiting). You pass bloody or black tarry stools.  There is bright red blood in the stool.  Constipation stays for more than 4 days.  There is rectal pain.  You do not seem to be getting better.  You have any questions or concerns.

## 2015-12-03 NOTE — ED Triage Notes (Signed)
Pt reports right side abd pain since this morning with n/v. Denies diarrhea

## 2015-12-03 NOTE — ED Notes (Signed)
Patient is alert and orientedx4.  Patient was explained discharge instructions and they understood them with no questions.  Arthur Vaughan, his girlfriend is taking the patient home.

## 2015-12-03 NOTE — ED Provider Notes (Signed)
[facility]-EMERGENCY DEPT Provider Note   CSN: [id number] Arrival date & time: 12/03/15  1110     History   Chief Complaint Chief Complaint  Patient presents with  . Abdominal Pain    HPI Arthur Vaughan is a 30 y.o. male.  The history is provided by the patient and medical records. No language interpreter was used.  Abdominal Pain   Associated symptoms include nausea. Pertinent negatives include fever, diarrhea, vomiting, constipation, dysuria and headaches.   Arthur Vaughan is a 30 y.o. male  with a PMH of appendectomy in 2015 and appendectomy in 2016 who presents to the Emergency Department complaining of right upper and lower abdominal pain which began last night. Associated symptoms include multiple episodes of emesis and nausea. She states pain was initially intermittent and sharp, has now transitioned to being a constant dull ache. He denies diarrhea, constipation, blood in the stool, fevers, chest pain, back pain, shortness of breath. He states he had a regular bowel movement this morning.  Past Medical History:  Diagnosis Date  . Asthma    history of mild asthma in middle school; exercise induced  . Gallbladder pain    biliary dyskenesia    There are no active problems to display for this patient.   Past Surgical History:  Procedure Laterality Date  . APPENDECTOMY  10/2013  . CHOLECYSTECTOMY N/A 05/14/2014   Procedure: LAPAROSCOPIC CHOLECYSTECTOMY;  Surgeon: Abigail Miyamotoouglas Blackman, MD;  Location: [facility] OR;  Service: General;  Laterality: N/A;  . WISDOM TOOTH EXTRACTION  2014       Home Medications    Prior to Admission medications   Medication Sig Start Date End Date Taking? Authorizing Provider  calcium carbonate (TUMS - DOSED IN MG ELEMENTAL CALCIUM) 500 MG chewable tablet Chew 1 tablet by mouth as needed for indigestion or heartburn.   Yes Historical Provider, MD  ciprofloxacin (CIPRO) 500 MG tablet Take 500 mg by mouth daily.  08/31/15  Yes Historical Provider, MD  ibuprofen  (ADVIL,MOTRIN) 200 MG tablet Take 800 mg by mouth every 6 (six) hours as needed for moderate pain.   Yes Historical Provider, MD  Simethicone (GAS-X PO) Take 2 tablets by mouth as needed (for stomach).   Yes Historical Provider, MD  oxyCODONE-acetaminophen (PERCOCET/ROXICET) 5-325 MG per tablet Take 1-2 tablets by mouth every 4 (four) hours as needed for moderate pain or severe pain. Patient not taking: Reported on 12/03/2015 05/14/14   Abigail Miyamotoouglas Blackman, MD    Family History History reviewed. No pertinent family history.  Social History Social History  Substance Use Topics  . Smoking status: Never Smoker  . Smokeless tobacco: Not on file  . Alcohol use Yes     Comment: 2- 3 x week ; not with present illness     Allergies   Patient has no known allergies.   Review of Systems Review of Systems  Constitutional: Negative for chills and fever.  HENT: Negative for congestion.   Eyes: Negative for visual disturbance.  Respiratory: Negative for cough and shortness of breath.   Cardiovascular: Negative.   Gastrointestinal: Positive for abdominal pain and nausea. Negative for blood in stool, constipation, diarrhea and vomiting.  Genitourinary: Negative for dysuria.  Musculoskeletal: Negative for back pain and neck pain.  Skin: Negative for rash.  Neurological: Negative for headaches.     Physical Exam Updated Vital Signs BP 120/67   Pulse 79   Temp 98.9 F (37.2 C) (Oral)   Resp 16   SpO2 100%   Physical Exam  Constitutional: He is oriented to person, place, and time. He appears well-developed and well-nourished. No distress.  HENT:  Head: Normocephalic and atraumatic.  Cardiovascular: Normal rate, regular rhythm and normal heart sounds.   No murmur heard. Pulmonary/Chest: Effort normal and breath sounds normal. No respiratory distress.  Abdominal:    Soft, non-distended. No rebound or guarding. BS + x4.   Musculoskeletal: He exhibits no edema.  Neurological: He is  alert and oriented to person, place, and time.  Skin: Skin is warm and dry.  Nursing note and vitals reviewed.    ED Treatments / Results  Labs (all labs ordered are listed, but only abnormal results are displayed) Labs Reviewed  COMPREHENSIVE METABOLIC PANEL - Abnormal; Notable for the following:       Result Value   Glucose, Bld 106 (*)    AST 148 (*)    ALT 109 (*)    Total Bilirubin 1.6 (*)    All other components within normal limits  URINALYSIS, ROUTINE W REFLEX MICROSCOPIC ([facility]) - Abnormal; Notable for the following:    Color, Urine AMBER (*)    Ketones, ur 40 (*)    All other components within normal limits  DIFFERENTIAL - Abnormal; Notable for the following:    Neutro Abs 8.0 (*)    Lymphs Abs 0.4 (*)    All other components within normal limits  LIPASE, BLOOD  CBC    EKG  EKG Interpretation None       Radiology No results found.  Procedures Procedures (including critical care time)  Medications Ordered in ED Medications  ondansetron (ZOFRAN) injection 4 mg (4 mg Intravenous Given 12/03/15 1157)  sodium chloride 0.9 % bolus 1,000 mL (0 mLs Intravenous Stopped 12/03/15 1356)  oxyCODONE-acetaminophen (PERCOCET/ROXICET) 5-325 MG per tablet 1 tablet (1 tablet Oral Given 12/03/15 1355)     Initial Impression / Assessment and Plan / ED Course  I have reviewed the triage vital signs and the nursing notes.  Pertinent labs & imaging results that were available during my care of the patient were reviewed by me and considered in my medical decision making (see chart for details).  Clinical Course    Arthur Vaughan is a 30 y.o. male with hx of appendectomy and cholecystectomy who presents to ED for nausea, vomiting and right sided abdominal pain. On exam, patient is afebrile, non-toxic appearing and hemodynamically stable with a non-surgical abdomen. He exhibits tenderness to the right side of his abdomen but no focal areas of tenderness. No rebound or  guarding and belly is soft. Labs reviewed and show normal white count and elytes. AST/ALT and bili mildly elevated. Zofran given and repeat abdominal exam unchanged with no peritoneal signs. Patient feeling better with no episodes of emesis in ED since Zofran administration. Tolerating PO and pain control. Followed by GI, Dr. Loreta AveMann. Evaluation does not show pathology that would require ongoing emergent intervention or inpatient treatment. Patient is hemodynamically stable and mentating appropriately. Will need to follow up with his GI physician for repeat abdominal exam and repeat of hepatic function labs. Significant amount of time was taken to discuss reasons to return to ED. Rx for zofran. Patient expresses understanding and agreement with plan as dictated above.    Final Clinical Impressions(s) / ED Diagnoses   Final diagnoses:  None    New Prescriptions New Prescriptions   No medications on file     [facility]ecaJaime Pilcher Tyray Proch, PA-C 12/03/15 1405    Raeford RazorStephen Kohut, MD 12/12/15 1135
